# Patient Record
Sex: Female | Born: 1950
Health system: Southern US, Community
[De-identification: ages and names within clinical notes are randomized; demographics above are authoritative.]

## PROBLEM LIST (undated history)

## (undated) DIAGNOSIS — Z8614 Personal history of Methicillin resistant Staphylococcus aureus infection: Secondary | ICD-10-CM

## (undated) DIAGNOSIS — E785 Hyperlipidemia, unspecified: Secondary | ICD-10-CM

## (undated) DIAGNOSIS — E559 Vitamin D deficiency, unspecified: Secondary | ICD-10-CM

## (undated) DIAGNOSIS — E119 Type 2 diabetes mellitus without complications: Secondary | ICD-10-CM

## (undated) DIAGNOSIS — L03039 Cellulitis of unspecified toe: Secondary | ICD-10-CM

## (undated) HISTORY — PX: BLADDER REPAIR: SHX76

## (undated) HISTORY — PX: ABDOMINAL HYSTERECTOMY: SHX81

---

## 2008-11-11 ENCOUNTER — Ambulatory Visit: Payer: Self-pay | Admitting: Obstetrics and Gynecology

## 2010-04-14 ENCOUNTER — Ambulatory Visit: Payer: Self-pay | Admitting: Internal Medicine

## 2011-06-29 ENCOUNTER — Ambulatory Visit: Payer: Self-pay | Admitting: Internal Medicine

## 2011-08-14 ENCOUNTER — Ambulatory Visit: Payer: Self-pay | Admitting: Gastroenterology

## 2012-08-16 ENCOUNTER — Ambulatory Visit: Payer: Self-pay | Admitting: Internal Medicine

## 2013-08-22 ENCOUNTER — Ambulatory Visit: Payer: Self-pay | Admitting: Internal Medicine

## 2014-08-28 ENCOUNTER — Ambulatory Visit: Payer: Self-pay | Admitting: Internal Medicine

## 2015-08-20 ENCOUNTER — Other Ambulatory Visit: Payer: Self-pay | Admitting: Internal Medicine

## 2015-08-20 DIAGNOSIS — Z1231 Encounter for screening mammogram for malignant neoplasm of breast: Secondary | ICD-10-CM

## 2015-08-30 ENCOUNTER — Ambulatory Visit
Admission: RE | Admit: 2015-08-30 | Discharge: 2015-08-30 | Disposition: A | Discharge status: Home / Self Care | Payer: Managed Care, Other (non HMO) | Source: Ambulatory Visit | Attending: Internal Medicine | Admitting: Internal Medicine

## 2015-08-30 DIAGNOSIS — Z1231 Encounter for screening mammogram for malignant neoplasm of breast: Secondary | ICD-10-CM | POA: Insufficient documentation

## 2016-10-19 ENCOUNTER — Other Ambulatory Visit: Payer: Self-pay | Admitting: Internal Medicine

## 2016-10-19 DIAGNOSIS — Z1231 Encounter for screening mammogram for malignant neoplasm of breast: Secondary | ICD-10-CM

## 2016-11-24 ENCOUNTER — Ambulatory Visit
Admission: RE | Admit: 2016-11-24 | Discharge: 2016-11-24 | Disposition: A | Discharge status: Home / Self Care | Payer: Managed Care, Other (non HMO) | Source: Ambulatory Visit | Attending: Internal Medicine | Admitting: Internal Medicine

## 2016-11-24 DIAGNOSIS — Z1231 Encounter for screening mammogram for malignant neoplasm of breast: Secondary | ICD-10-CM | POA: Insufficient documentation

## 2017-01-18 ENCOUNTER — Encounter: Payer: Self-pay | Admitting: *Deleted

## 2017-01-19 ENCOUNTER — Ambulatory Visit: Payer: Medicare HMO | Admitting: Anesthesiology

## 2017-01-19 ENCOUNTER — Ambulatory Visit
Admission: RE | Admit: 2017-01-19 | Discharge: 2017-01-19 | Disposition: A | Discharge status: Home / Self Care | Payer: Medicare HMO | Source: Ambulatory Visit | Attending: Unknown Physician Specialty | Admitting: Unknown Physician Specialty

## 2017-01-19 ENCOUNTER — Encounter
Admission: RE | Disposition: A | Discharge status: Home / Self Care | Payer: Self-pay | Source: Ambulatory Visit | Attending: Unknown Physician Specialty

## 2017-01-19 DIAGNOSIS — E119 Type 2 diabetes mellitus without complications: Secondary | ICD-10-CM | POA: Insufficient documentation

## 2017-01-19 DIAGNOSIS — Z79899 Other long term (current) drug therapy: Secondary | ICD-10-CM | POA: Diagnosis not present

## 2017-01-19 DIAGNOSIS — K640 First degree hemorrhoids: Secondary | ICD-10-CM | POA: Diagnosis not present

## 2017-01-19 DIAGNOSIS — Z86010 Personal history of colonic polyps: Secondary | ICD-10-CM | POA: Insufficient documentation

## 2017-01-19 DIAGNOSIS — Z7984 Long term (current) use of oral hypoglycemic drugs: Secondary | ICD-10-CM | POA: Insufficient documentation

## 2017-01-19 DIAGNOSIS — E559 Vitamin D deficiency, unspecified: Secondary | ICD-10-CM | POA: Insufficient documentation

## 2017-01-19 DIAGNOSIS — K635 Polyp of colon: Secondary | ICD-10-CM | POA: Diagnosis not present

## 2017-01-19 DIAGNOSIS — D124 Benign neoplasm of descending colon: Secondary | ICD-10-CM | POA: Diagnosis not present

## 2017-01-19 DIAGNOSIS — Z1211 Encounter for screening for malignant neoplasm of colon: Secondary | ICD-10-CM | POA: Insufficient documentation

## 2017-01-19 DIAGNOSIS — Z800 Family history of malignant neoplasm of digestive organs: Secondary | ICD-10-CM | POA: Insufficient documentation

## 2017-01-19 DIAGNOSIS — E785 Hyperlipidemia, unspecified: Secondary | ICD-10-CM | POA: Diagnosis not present

## 2017-01-19 DIAGNOSIS — D123 Benign neoplasm of transverse colon: Secondary | ICD-10-CM | POA: Insufficient documentation

## 2017-01-19 HISTORY — DX: Vitamin D deficiency, unspecified: E55.9

## 2017-01-19 HISTORY — PX: COLONOSCOPY WITH PROPOFOL: SHX5780

## 2017-01-19 HISTORY — DX: Cellulitis of unspecified toe: L03.039

## 2017-01-19 HISTORY — DX: Type 2 diabetes mellitus without complications: E11.9

## 2017-01-19 HISTORY — DX: Hyperlipidemia, unspecified: E78.5

## 2017-01-19 LAB — GLUCOSE, CAPILLARY: GLUCOSE-CAPILLARY: 224 mg/dL — AB (ref 65–99)

## 2017-01-19 SURGERY — COLONOSCOPY WITH PROPOFOL
Anesthesia: General

## 2017-01-19 MED ORDER — PHENYLEPHRINE HCL 10 MG/ML IJ SOLN
INTRAMUSCULAR | Status: AC
  Filled 2017-01-19: qty 1

## 2017-01-19 MED ORDER — PROPOFOL 10 MG/ML IV BOLUS
INTRAVENOUS | Status: DC | PRN
  Administered 2017-01-19: 30 mg via INTRAVENOUS

## 2017-01-19 MED ORDER — SODIUM CHLORIDE 0.9 % IV SOLN: INTRAVENOUS | Status: DC

## 2017-01-19 MED ORDER — EPHEDRINE 5 MG/ML INJ
INTRAVENOUS | Status: AC
  Filled 2017-01-19: qty 10

## 2017-01-19 MED ORDER — FENTANYL CITRATE (PF) 100 MCG/2ML IJ SOLN
INTRAMUSCULAR | Status: AC
  Filled 2017-01-19: qty 2

## 2017-01-19 MED ORDER — EPHEDRINE SULFATE 50 MG/ML IJ SOLN
INTRAMUSCULAR | Status: DC | PRN
  Administered 2017-01-19: 10 mg via INTRAVENOUS

## 2017-01-19 MED ORDER — PROPOFOL 500 MG/50ML IV EMUL
INTRAVENOUS | Status: AC
  Filled 2017-01-19: qty 50

## 2017-01-19 MED ORDER — SODIUM CHLORIDE 0.9 % IV SOLN
INTRAVENOUS | Status: DC
  Administered 2017-01-19: 1000 mL via INTRAVENOUS

## 2017-01-19 MED ORDER — MIDAZOLAM HCL 2 MG/2ML IJ SOLN
INTRAMUSCULAR | Status: DC | PRN
  Administered 2017-01-19: 2 mg via INTRAVENOUS

## 2017-01-19 MED ORDER — LIDOCAINE HCL 2 % EX GEL
CUTANEOUS | Status: AC
  Filled 2017-01-19: qty 5

## 2017-01-19 MED ORDER — FENTANYL CITRATE (PF) 100 MCG/2ML IJ SOLN
INTRAMUSCULAR | Status: DC | PRN
  Administered 2017-01-19: 100 ug via INTRAVENOUS

## 2017-01-19 MED ORDER — MIDAZOLAM HCL 2 MG/2ML IJ SOLN
INTRAMUSCULAR | Status: AC
  Filled 2017-01-19: qty 2

## 2017-01-19 MED ORDER — PROPOFOL 500 MG/50ML IV EMUL
INTRAVENOUS | Status: DC | PRN
  Administered 2017-01-19: 120 ug/kg/min via INTRAVENOUS

## 2017-01-19 MED ORDER — LIDOCAINE HCL (PF) 2 % IJ SOLN
INTRAMUSCULAR | Status: AC
  Filled 2017-01-19: qty 2

## 2017-01-19 NOTE — Transfer of Care (Signed)
Immediate Anesthesia Transfer of Care Note  Patient: Holly Luna  Procedure(s) Performed: Procedure(s): COLONOSCOPY WITH PROPOFOL (N/A)  Patient Location: PACU  Anesthesia Type:General  Level of Consciousness: awake  Airway & Oxygen Therapy: Patient Spontanous Breathing and Patient connected to nasal cannula oxygen  Post-op Assessment: Report given to RN and Post -op Vital signs reviewed and stable  Post vital signs: Reviewed  Last Vitals:  Vitals:   01/19/17 0650  BP: 122/63  Pulse: 68  Resp: 16  Temp: 36.8 C    Last Pain:  Vitals:   01/19/17 0650  TempSrc: Tympanic         Complications: No apparent anesthesia complications

## 2017-01-19 NOTE — Anesthesia Postprocedure Evaluation (Signed)
Anesthesia Post Note  Patient: Holly Luna  Procedure(s) Performed: Procedure(s) (LRB): COLONOSCOPY WITH PROPOFOL (N/A)  Patient location during evaluation: PACU Anesthesia Type: General Level of consciousness: awake Pain management: pain level controlled Vital Signs Assessment: post-procedure vital signs reviewed and stable Respiratory status: spontaneous breathing Cardiovascular status: stable Anesthetic complications: no     Last Vitals:  Vitals:   01/19/17 0815 01/19/17 0825  BP: (!) 103/55 108/66  Pulse: 79 75  Resp: 19 18  Temp: 36.2 C     Last Pain:  Vitals:   01/19/17 0815  TempSrc: Tympanic                 VAN STAVEREN,Shakara Tweedy

## 2017-01-19 NOTE — H&P (Signed)
   Primary Care Physician:  Rusty Aus, MD Primary Gastroenterologist:  Dr. Vira Agar  Pre-Procedure History & Physical: HPI:  Holly Luna is a 66 y.o. female is here for an colonoscopy.   Past Medical History:  Diagnosis Date  . Diabetes mellitus without complication ([place])   . Hyperlipidemia   . Onychia, toe   . Vitamin D deficiency     Past Surgical History:  Procedure Laterality Date  . ABDOMINAL HYSTERECTOMY    . BLADDER REPAIR      Prior to Admission medications   Medication Sig Start Date End Date Taking? Authorizing Provider  Cholecalciferol 10000 units TABS Take 1 tablet by mouth daily.   Yes Historical Provider, MD  cyanocobalamin 1000 MCG tablet Take 1,000 mcg by mouth daily.   Yes Historical Provider, MD  estradiol (ESTRACE) 1 MG tablet Take 1 mg by mouth daily.   Yes Historical Provider, MD  glimepiride (AMARYL) 4 MG tablet Take 4 mg by mouth daily with breakfast.   Yes Historical Provider, MD  Liraglutide (VICTOZA [place]) Inject 1.2 mg into the skin daily.   Yes Historical Provider, MD  lisinopril (PRINIVIL,ZESTRIL) 5 MG tablet Take 5 mg by mouth daily.   Yes Historical Provider, MD  metFORMIN (GLUCOPHAGE) 500 MG tablet Take 1,000 mg by mouth 2 (two) times daily with a meal.   Yes Historical Provider, MD  simvastatin (ZOCOR) 20 MG tablet Take 20 mg by mouth daily.    Historical Provider, MD    Allergies as of 12/28/2016  . (Not on File)    No family history on file.  Social History   Social History  . Marital status: Married    Spouse name: N/A  . Number of children: N/A  . Years of education: N/A   Occupational History  . Not on file.   Social History Main Topics  . Smoking status: Not on file  . Smokeless tobacco: Not on file  . Alcohol use Not on file  . Drug use: Unknown  . Sexual activity: Not on file   Other Topics Concern  . Not on file   Social History Narrative  . No narrative on file    Review of Systems: See HPI, otherwise  negative ROS  Physical Exam: BP 122/63   Pulse 68   Temp 98.3 F (36.8 C) (Tympanic)   Resp 16   Ht 5\' 3"  (1.6 m)   Wt 70.3 kg (155 lb)   SpO2 97%   BMI 27.46 kg/m  General:   Alert,  pleasant and cooperative in NAD Head:  Normocephalic and atraumatic. Neck:  Supple; no masses or thyromegaly. Lungs:  Clear throughout to auscultation.    Heart:  Regular rate and rhythm. Abdomen:  Soft, nontender and nondistended. Normal bowel sounds, without guarding, and without rebound.   Neurologic:  Alert and  oriented x4;  grossly normal neurologically.  Impression/Plan: Holly Luna is here for an colonoscopy to be performed for [facility] colon polyps and family history colon cancer in father  Risks, benefits, limitations, and alternatives regarding  colonoscopy have been reviewed with the patient.  Questions have been answered.  All parties agreeable.   Gaylyn Cheers, MD  01/19/2017, 7:30 AM

## 2017-01-19 NOTE — Anesthesia Post-op Follow-up Note (Signed)
Anesthesia QCDR form completed.        

## 2017-01-19 NOTE — Op Note (Signed)
[facility] Gastroenterology Patient Name: Holly Luna Procedure Date: 01/19/2017 7:20 AM MRN: [id number] Account #: [id number] Date of Birth: [birth year] Admit Type: Outpatient Age: 66 Room: [facility] ENDO ROOM 1 Gender: Female Note Status: Finalized Procedure:            Colonoscopy Indications:          Screening for colorectal malignant neoplasm Providers:            Manya Silvas, MD Referring MD:         Rusty Aus, MD (Referring MD) Medicines:            Propofol per Anesthesia Complications:        No immediate complications. Procedure:            Pre-Anesthesia Assessment:                       - After reviewing the risks and benefits, the patient                        was deemed in satisfactory condition to undergo the                        procedure.                       After obtaining informed consent, the colonoscope was                        passed under direct vision. Throughout the procedure,                        the patient's blood pressure, pulse, and oxygen                        saturations were monitored continuously. The                        Colonoscope was introduced through the anus and                        advanced to the the cecum, identified by appendiceal                        orifice and ileocecal valve. The colonoscopy was                        performed without difficulty. The patient tolerated the                        procedure well. The quality of the bowel preparation                        was good. Findings:      Six sessile polyps were found in the recto-sigmoid colon, sigmoid colon,       descending colon, splenic flexure, transverse colon and hepatic flexure.       The polyps were diminutive in size. These polyps were removed with a       jumbo cold forceps. Resection and retrieval were complete. These polyps       were removed with a jumbo cold forceps. Resection and retrieval  were       complete.  Internal hemorrhoids were found during endoscopy. The hemorrhoids were       small and Grade I (internal hemorrhoids that do not prolapse).      The exam was otherwise without abnormality. Impression:           - Six diminutive polyps at the recto-sigmoid colon, in                        the sigmoid colon, in the descending colon, at the                        splenic flexure, in the transverse colon and at the                        hepatic flexure, removed with a jumbo cold forceps.                        Resected and retrieved.                       - Internal hemorrhoids.                       - The examination was otherwise normal. Recommendation:       - Repeat colonoscopy in 3 years for surveillance. Manya Silvas, MD 01/19/2017 8:15:56 AM This report has been signed electronically. Number of Addenda: 0 Note Initiated On: 01/19/2017 7:20 AM Scope Withdrawal Time: 0 hours 25 minutes 37 seconds  Total Procedure Duration: 0 hours 32 minutes 38 seconds       [facility]

## 2017-01-19 NOTE — Anesthesia Preprocedure Evaluation (Signed)
Anesthesia Evaluation  Patient identified by MRN, date of birth, ID band Patient awake    Reviewed: Allergy & Precautions, NPO status , Patient's Chart, lab work & pertinent test results  Airway Mallampati: II       Dental  (+) Teeth Intact   Pulmonary neg pulmonary ROS,    breath sounds clear to auscultation       Cardiovascular Exercise Tolerance: Good  Rhythm:Regular     Neuro/Psych negative neurological ROS  negative psych ROS   GI/Hepatic negative GI ROS, Neg liver ROS,   Endo/Other  negative endocrine ROSdiabetes, Type 2, Oral Hypoglycemic Agents  Renal/GU negative Renal ROS     Musculoskeletal   Abdominal Normal abdominal exam  (+)   Peds  Hematology negative hematology ROS (+)   Anesthesia Other Findings   Reproductive/Obstetrics                             Anesthesia Physical Anesthesia Plan  ASA: II  Anesthesia Plan: General   Post-op Pain Management:    Induction: Intravenous  Airway Management Planned: Natural Airway and Nasal Cannula  Additional Equipment:   Intra-op Plan:   Post-operative Plan:   Informed Consent: I have reviewed the patients History and Physical, chart, labs and discussed the procedure including the risks, benefits and alternatives for the proposed anesthesia with the patient or authorized representative who has indicated his/her understanding and acceptance.     Plan Discussed with: CRNA  Anesthesia Plan Comments:         Anesthesia Quick Evaluation

## 2017-01-22 ENCOUNTER — Encounter: Payer: Self-pay | Admitting: Unknown Physician Specialty

## 2017-01-22 LAB — SURGICAL PATHOLOGY

## 2017-12-31 ENCOUNTER — Other Ambulatory Visit: Payer: Self-pay | Admitting: Internal Medicine

## 2017-12-31 DIAGNOSIS — Z1231 Encounter for screening mammogram for malignant neoplasm of breast: Secondary | ICD-10-CM

## 2018-01-28 ENCOUNTER — Ambulatory Visit
Admission: RE | Admit: 2018-01-28 | Discharge: 2018-01-28 | Disposition: A | Discharge status: Home / Self Care | Payer: Medicare HMO | Source: Ambulatory Visit | Attending: Internal Medicine | Admitting: Internal Medicine

## 2018-01-28 DIAGNOSIS — Z1231 Encounter for screening mammogram for malignant neoplasm of breast: Secondary | ICD-10-CM

## 2019-02-24 ENCOUNTER — Other Ambulatory Visit: Payer: Self-pay | Admitting: Internal Medicine

## 2019-02-24 DIAGNOSIS — Z1231 Encounter for screening mammogram for malignant neoplasm of breast: Secondary | ICD-10-CM

## 2020-03-08 ENCOUNTER — Ambulatory Visit
Admission: RE | Admit: 2020-03-08 | Discharge: 2020-03-08 | Disposition: A | Discharge status: Home / Self Care | Payer: Medicare HMO | Source: Ambulatory Visit | Attending: Internal Medicine | Admitting: Internal Medicine

## 2020-03-08 DIAGNOSIS — Z1231 Encounter for screening mammogram for malignant neoplasm of breast: Secondary | ICD-10-CM | POA: Diagnosis not present

## 2020-03-10 ENCOUNTER — Other Ambulatory Visit: Payer: Self-pay | Admitting: Internal Medicine

## 2020-03-10 DIAGNOSIS — R928 Other abnormal and inconclusive findings on diagnostic imaging of breast: Secondary | ICD-10-CM

## 2020-03-10 DIAGNOSIS — N6320 Unspecified lump in the left breast, unspecified quadrant: Secondary | ICD-10-CM

## 2020-03-19 ENCOUNTER — Ambulatory Visit
Admission: RE | Admit: 2020-03-19 | Discharge: 2020-03-19 | Disposition: A | Discharge status: Home / Self Care | Payer: Medicare HMO | Source: Ambulatory Visit | Attending: Internal Medicine | Admitting: Internal Medicine

## 2020-03-19 DIAGNOSIS — R928 Other abnormal and inconclusive findings on diagnostic imaging of breast: Secondary | ICD-10-CM | POA: Insufficient documentation

## 2020-03-19 DIAGNOSIS — N6320 Unspecified lump in the left breast, unspecified quadrant: Secondary | ICD-10-CM | POA: Diagnosis present

## 2020-03-24 ENCOUNTER — Other Ambulatory Visit: Payer: Self-pay | Admitting: Internal Medicine

## 2020-03-24 DIAGNOSIS — N6320 Unspecified lump in the left breast, unspecified quadrant: Secondary | ICD-10-CM

## 2020-11-29 ENCOUNTER — Ambulatory Visit
Admission: RE | Admit: 2020-11-29 | Discharge: 2020-11-29 | Disposition: A | Discharge status: Home / Self Care | Payer: Medicare HMO | Source: Ambulatory Visit | Attending: Internal Medicine | Admitting: Internal Medicine

## 2020-11-29 ENCOUNTER — Other Ambulatory Visit: Payer: Self-pay

## 2020-11-29 DIAGNOSIS — N6002 Solitary cyst of left breast: Secondary | ICD-10-CM | POA: Diagnosis not present

## 2020-11-29 DIAGNOSIS — N6320 Unspecified lump in the left breast, unspecified quadrant: Secondary | ICD-10-CM | POA: Diagnosis present

## 2021-01-31 DIAGNOSIS — M79675 Pain in left toe(s): Secondary | ICD-10-CM | POA: Diagnosis not present

## 2021-01-31 DIAGNOSIS — M79674 Pain in right toe(s): Secondary | ICD-10-CM | POA: Diagnosis not present

## 2021-01-31 DIAGNOSIS — B351 Tinea unguium: Secondary | ICD-10-CM | POA: Diagnosis not present

## 2021-01-31 DIAGNOSIS — L601 Onycholysis: Secondary | ICD-10-CM | POA: Diagnosis not present

## 2021-01-31 DIAGNOSIS — E119 Type 2 diabetes mellitus without complications: Secondary | ICD-10-CM | POA: Diagnosis not present

## 2021-02-03 DIAGNOSIS — I152 Hypertension secondary to endocrine disorders: Secondary | ICD-10-CM | POA: Diagnosis not present

## 2021-02-03 DIAGNOSIS — E1165 Type 2 diabetes mellitus with hyperglycemia: Secondary | ICD-10-CM | POA: Diagnosis not present

## 2021-02-03 DIAGNOSIS — E1159 Type 2 diabetes mellitus with other circulatory complications: Secondary | ICD-10-CM | POA: Diagnosis not present

## 2021-02-03 DIAGNOSIS — E785 Hyperlipidemia, unspecified: Secondary | ICD-10-CM | POA: Diagnosis not present

## 2021-02-03 DIAGNOSIS — Z794 Long term (current) use of insulin: Secondary | ICD-10-CM | POA: Diagnosis not present

## 2021-02-03 DIAGNOSIS — E1169 Type 2 diabetes mellitus with other specified complication: Secondary | ICD-10-CM | POA: Diagnosis not present

## 2021-05-23 DIAGNOSIS — Z794 Long term (current) use of insulin: Secondary | ICD-10-CM | POA: Diagnosis not present

## 2021-05-23 DIAGNOSIS — I152 Hypertension secondary to endocrine disorders: Secondary | ICD-10-CM | POA: Diagnosis not present

## 2021-05-23 DIAGNOSIS — E785 Hyperlipidemia, unspecified: Secondary | ICD-10-CM | POA: Diagnosis not present

## 2021-05-23 DIAGNOSIS — E1165 Type 2 diabetes mellitus with hyperglycemia: Secondary | ICD-10-CM | POA: Diagnosis not present

## 2021-05-23 DIAGNOSIS — E1169 Type 2 diabetes mellitus with other specified complication: Secondary | ICD-10-CM | POA: Diagnosis not present

## 2021-05-23 DIAGNOSIS — E1159 Type 2 diabetes mellitus with other circulatory complications: Secondary | ICD-10-CM | POA: Diagnosis not present

## 2021-06-06 DIAGNOSIS — E1165 Type 2 diabetes mellitus with hyperglycemia: Secondary | ICD-10-CM | POA: Diagnosis not present

## 2021-06-13 DIAGNOSIS — Z794 Long term (current) use of insulin: Secondary | ICD-10-CM | POA: Diagnosis not present

## 2021-06-13 DIAGNOSIS — Z0000 Encounter for general adult medical examination without abnormal findings: Secondary | ICD-10-CM | POA: Diagnosis not present

## 2021-06-13 DIAGNOSIS — E1165 Type 2 diabetes mellitus with hyperglycemia: Secondary | ICD-10-CM | POA: Diagnosis not present

## 2021-06-13 DIAGNOSIS — D500 Iron deficiency anemia secondary to blood loss (chronic): Secondary | ICD-10-CM | POA: Diagnosis not present

## 2021-06-13 DIAGNOSIS — E782 Mixed hyperlipidemia: Secondary | ICD-10-CM | POA: Diagnosis not present

## 2021-06-13 DIAGNOSIS — D369 Benign neoplasm, unspecified site: Secondary | ICD-10-CM | POA: Diagnosis not present

## 2021-06-13 DIAGNOSIS — E538 Deficiency of other specified B group vitamins: Secondary | ICD-10-CM | POA: Diagnosis not present

## 2021-07-11 DIAGNOSIS — E538 Deficiency of other specified B group vitamins: Secondary | ICD-10-CM | POA: Diagnosis not present

## 2021-07-11 DIAGNOSIS — D500 Iron deficiency anemia secondary to blood loss (chronic): Secondary | ICD-10-CM | POA: Diagnosis not present

## 2021-08-15 DIAGNOSIS — D509 Iron deficiency anemia, unspecified: Secondary | ICD-10-CM | POA: Diagnosis not present

## 2021-08-24 DIAGNOSIS — D509 Iron deficiency anemia, unspecified: Secondary | ICD-10-CM | POA: Diagnosis not present

## 2021-10-07 ENCOUNTER — Encounter: Payer: Self-pay | Admitting: *Deleted

## 2021-10-10 ENCOUNTER — Ambulatory Visit
Admission: RE | Admit: 2021-10-10 | Discharge: 2021-10-10 | Disposition: A | Discharge status: Home / Self Care | Payer: HMO | Attending: Gastroenterology | Admitting: Gastroenterology

## 2021-10-10 ENCOUNTER — Ambulatory Visit: Payer: HMO | Admitting: Certified Registered Nurse Anesthetist

## 2021-10-10 ENCOUNTER — Encounter: Payer: Self-pay | Admitting: *Deleted

## 2021-10-10 ENCOUNTER — Encounter
Admission: RE | Disposition: A | Discharge status: Home / Self Care | Payer: Self-pay | Source: Home / Self Care | Attending: Gastroenterology

## 2021-10-10 DIAGNOSIS — K2950 Unspecified chronic gastritis without bleeding: Secondary | ICD-10-CM | POA: Diagnosis not present

## 2021-10-10 DIAGNOSIS — D122 Benign neoplasm of ascending colon: Secondary | ICD-10-CM | POA: Insufficient documentation

## 2021-10-10 DIAGNOSIS — Q402 Other specified congenital malformations of stomach: Secondary | ICD-10-CM | POA: Diagnosis not present

## 2021-10-10 DIAGNOSIS — K3189 Other diseases of stomach and duodenum: Secondary | ICD-10-CM | POA: Diagnosis not present

## 2021-10-10 DIAGNOSIS — Z7985 Long-term (current) use of injectable non-insulin antidiabetic drugs: Secondary | ICD-10-CM | POA: Insufficient documentation

## 2021-10-10 DIAGNOSIS — Z794 Long term (current) use of insulin: Secondary | ICD-10-CM | POA: Diagnosis not present

## 2021-10-10 DIAGNOSIS — Z7984 Long term (current) use of oral hypoglycemic drugs: Secondary | ICD-10-CM | POA: Diagnosis not present

## 2021-10-10 DIAGNOSIS — K222 Esophageal obstruction: Secondary | ICD-10-CM | POA: Insufficient documentation

## 2021-10-10 DIAGNOSIS — Z79899 Other long term (current) drug therapy: Secondary | ICD-10-CM | POA: Diagnosis not present

## 2021-10-10 DIAGNOSIS — D509 Iron deficiency anemia, unspecified: Secondary | ICD-10-CM | POA: Diagnosis not present

## 2021-10-10 DIAGNOSIS — Z79890 Hormone replacement therapy (postmenopausal): Secondary | ICD-10-CM | POA: Insufficient documentation

## 2021-10-10 DIAGNOSIS — K269 Duodenal ulcer, unspecified as acute or chronic, without hemorrhage or perforation: Secondary | ICD-10-CM | POA: Insufficient documentation

## 2021-10-10 DIAGNOSIS — K635 Polyp of colon: Secondary | ICD-10-CM | POA: Diagnosis not present

## 2021-10-10 DIAGNOSIS — K2980 Duodenitis without bleeding: Secondary | ICD-10-CM | POA: Diagnosis not present

## 2021-10-10 HISTORY — PX: COLONOSCOPY: SHX5424

## 2021-10-10 HISTORY — PX: ESOPHAGOGASTRODUODENOSCOPY: SHX5428

## 2021-10-10 HISTORY — DX: Personal history of Methicillin resistant Staphylococcus aureus infection: Z86.14

## 2021-10-10 LAB — GLUCOSE, CAPILLARY: Glucose-Capillary: 114 mg/dL — ABNORMAL HIGH (ref 70–99)

## 2021-10-10 SURGERY — COLONOSCOPY
Anesthesia: General

## 2021-10-10 MED ORDER — PROPOFOL 500 MG/50ML IV EMUL
INTRAVENOUS | Status: AC
  Filled 2021-10-10: qty 50

## 2021-10-10 MED ORDER — GLYCOPYRROLATE 0.2 MG/ML IJ SOLN
INTRAMUSCULAR | Status: DC | PRN
  Administered 2021-10-10: .2 mg via INTRAVENOUS

## 2021-10-10 MED ORDER — PROPOFOL 500 MG/50ML IV EMUL
INTRAVENOUS | Status: DC | PRN
  Administered 2021-10-10: 150 ug/kg/min via INTRAVENOUS

## 2021-10-10 MED ORDER — LIDOCAINE HCL (PF) 2 % IJ SOLN
INTRAMUSCULAR | Status: AC
  Filled 2021-10-10: qty 5

## 2021-10-10 MED ORDER — LIDOCAINE HCL (CARDIAC) PF 100 MG/5ML IV SOSY
PREFILLED_SYRINGE | INTRAVENOUS | Status: DC | PRN
  Administered 2021-10-10: 50 mg via INTRAVENOUS

## 2021-10-10 MED ORDER — SODIUM CHLORIDE 0.9 % IV SOLN
INTRAVENOUS | Status: DC
  Administered 2021-10-10: 20 mL/h via INTRAVENOUS

## 2021-10-10 MED ORDER — PROPOFOL 10 MG/ML IV BOLUS
INTRAVENOUS | Status: DC | PRN
  Administered 2021-10-10: 60 mg via INTRAVENOUS

## 2021-10-10 NOTE — Op Note (Signed)
[facility] Gastroenterology Patient Name: Holly Luna Procedure Date: 10/10/2021 8:29 AM MRN: [id number] Account #: [id number] Date of Birth: 1951-01-21 Admit Type: Outpatient Age: 70 Room: [facility] ENDO ROOM 2 Gender: Female Note Status: Finalized Instrument Name: Colonscope [id number] Procedure:             Colonoscopy Indications:           Iron deficiency anemia Providers:             Annamaria Helling DO, DO Medicines:             Monitored Anesthesia Care Complications:         No immediate complications. Estimated blood loss:                         Minimal. Procedure:             Pre-Anesthesia Assessment:                        - Prior to the procedure, a History and Physical was                         performed, and patient medications and allergies were                         reviewed. The patient is competent. The risks and                         benefits of the procedure and the sedation options and                         risks were discussed with the patient. All questions                         were answered and informed consent was obtained.                         Patient identification and proposed procedure were                         verified by the physician, the nurse, the anesthetist                         and the technician in the endoscopy suite. Mental                         Status Examination: alert and oriented. Airway                         Examination: normal oropharyngeal airway and neck                         mobility. Respiratory Examination: clear to                         auscultation. CV Examination: RRR, no murmurs, no S3                         or S4. Prophylactic Antibiotics: The patient does not  require prophylactic antibiotics. Prior                         Anticoagulants: The patient has taken no previous                         anticoagulant or antiplatelet agents. ASA Grade                          Assessment: II - A patient with mild systemic disease.                         After reviewing the risks and benefits, the patient                         was deemed in satisfactory condition to undergo the                         procedure. The anesthesia plan was to use monitored                         anesthesia care (MAC). Immediately prior to                         administration of medications, the patient was                         re-assessed for adequacy to receive sedatives. The                         heart rate, respiratory rate, oxygen saturations,                         blood pressure, adequacy of pulmonary ventilation, and                         response to care were monitored throughout the                         procedure. The physical status of the patient was                         re-assessed after the procedure.                        After obtaining informed consent, the colonoscope was                         passed under direct vision. Throughout the procedure,                         the patient's blood pressure, pulse, and oxygen                         saturations were monitored continuously. The                         Colonoscope was introduced through the anus and  advanced to the the cecum, identified by appendiceal                         orifice and ileocecal valve. The colonoscopy was                         performed without difficulty. The patient tolerated                         the procedure well. The quality of the bowel                         preparation was evaluated using the BBPS [facility] Bowel                         Preparation Scale) with scores of: Right Colon = 3                         (entire mucosa seen well with no residual staining,                         small fragments of stool or opaque liquid), Transverse                         Colon = 3 (entire mucosa seen well with no residual                          staining, small fragments of stool or opaque liquid)                         and Left Colon = 2 (minor amount of residual staining,                         small fragments of stool and/or opaque liquid, but                         mucosa seen well). The total BBPS score equals 8. The                         quality of the bowel preparation was excellent. Findings:      The perianal and digital rectal examinations were normal. Pertinent       negatives include normal sphincter tone.      A 2 to 3 mm polyp was found in the ascending colon. The polyp was       sessile. The polyp was removed with a cold biopsy forceps. Resection and       retrieval were complete. Estimated blood loss was minimal.      The exam was otherwise without abnormality on direct and retroflexion       views. Impression:            - One 2 to 3 mm polyp in the ascending colon, removed                         with a cold biopsy forceps. Resected and retrieved.                        -  The examination was otherwise normal on direct and                         retroflexion views. Recommendation:        - Discharge patient to home (ambulatory).                        - Resume previous diet.                        - Continue present medications.                        - Await pathology results.                        - Repeat colonoscopy for surveillance based on                         pathology results.                        - Return to referring physician as previously                         scheduled. Procedure Code(s):     --- Professional ---                        [phone], Colonoscopy, flexible; with biopsy, single or                         multiple Diagnosis Code(s):     --- Professional ---                        K63.5, Polyp of colon                        D50.9, Iron deficiency anemia, unspecified CPT copyright 2019 American Medical Association. All rights reserved. The codes documented in this  report are preliminary and upon coder review may  be revised to meet current compliance requirements. Attending Participation:      I personally performed the entire procedure. Volney American, DO Annamaria Helling DO, DO 10/10/2021 9:58:26 AM This report has been signed electronically. Number of Addenda: 0 Note Initiated On: 10/10/2021 8:29 AM Scope Withdrawal Time: 0 hours 18 minutes 47 seconds  Total Procedure Duration: 0 hours 37 minutes 39 seconds  Estimated Blood Loss:  Estimated blood loss was minimal.      [facility]

## 2021-10-10 NOTE — Interval H&P Note (Signed)
History and Physical Interval Note: Preprocedure H&P from 10/10/21  was reviewed and there was no interval change after seeing and examining the patient.  Written consent was obtained from the patient after discussion of risks, benefits, and alternatives. Patient has consented to proceed with Esophagogastroduodenoscopy and Colonoscopy with possible intervention   10/10/2021 8:48 AM  Holly Luna  has presented today for surgery, with the diagnosis of IDA.  The various methods of treatment have been discussed with the patient and family. After consideration of risks, benefits and other options for treatment, the patient has consented to  Procedure(s): COLONOSCOPY (N/A) ESOPHAGOGASTRODUODENOSCOPY (EGD) (N/A) as a surgical intervention.  The patient's history has been reviewed, patient examined, no change in status, stable for surgery.  I have reviewed the patient's chart and labs.  Questions were answered to the patient's satisfaction.     Holly Luna

## 2021-10-10 NOTE — Transfer of Care (Signed)
Immediate Anesthesia Transfer of Care Note  Patient: Holly Luna  Procedure(s) Performed: COLONOSCOPY ESOPHAGOGASTRODUODENOSCOPY (EGD)  Patient Location: Endoscopy Unit  Anesthesia Type:General  Level of Consciousness: drowsy  Airway & Oxygen Therapy: Patient Spontanous Breathing, oral airway in place  Post-op Assessment: Report given to RN and Post -op Vital signs reviewed and stable  Post vital signs: Reviewed and stable  Last Vitals:  Vitals Value Taken Time  BP 126/64 10/10/21 0945  Temp    Pulse 77 10/10/21 0945  Resp 23 10/10/21 0945  SpO2 97 % 10/10/21 0945  Vitals shown include unvalidated device data.  Last Pain:  Vitals:   10/10/21 0729  TempSrc: Temporal  PainSc: 0-No pain         Complications: No notable events documented.

## 2021-10-10 NOTE — Op Note (Signed)
[facility] Gastroenterology Patient Name: Holly Luna Procedure Date: 10/10/2021 8:30 AM MRN: [id number] Account #: [id number] Date of Birth: Nov 07, 1951 Admit Type: Outpatient Age: 70 Room: [facility] ENDO ROOM 2 Gender: Female Note Status: Finalized Instrument Name: Upper Endoscope [id number] Procedure:             Upper GI endoscopy Indications:           Iron deficiency anemia Providers:             Annamaria Helling DO, DO Medicines:             Monitored Anesthesia Care Complications:         No immediate complications. Estimated blood loss:                         Minimal. Procedure:             Pre-Anesthesia Assessment:                        - Prior to the procedure, a History and Physical was                         performed, and patient medications and allergies were                         reviewed. The patient is competent. The risks and                         benefits of the procedure and the sedation options and                         risks were discussed with the patient. All questions                         were answered and informed consent was obtained.                         Patient identification and proposed procedure were                         verified by the physician, the nurse, the anesthetist                         and the technician in the endoscopy suite. Mental                         Status Examination: alert and oriented. Airway                         Examination: normal oropharyngeal airway and neck                         mobility. Respiratory Examination: clear to                         auscultation. CV Examination: RRR, no murmurs, no S3                         or S4. Prophylactic Antibiotics: The patient does  not                         require prophylactic antibiotics. Prior                         Anticoagulants: The patient has taken no previous                         anticoagulant or antiplatelet agents. ASA Grade                          Assessment: II - A patient with mild systemic disease.                         After reviewing the risks and benefits, the patient                         was deemed in satisfactory condition to undergo the                         procedure. The anesthesia plan was to use monitored                         anesthesia care (MAC). Immediately prior to                         administration of medications, the patient was                         re-assessed for adequacy to receive sedatives. The                         heart rate, respiratory rate, oxygen saturations,                         blood pressure, adequacy of pulmonary ventilation, and                         response to care were monitored throughout the                         procedure. The physical status of the patient was                         re-assessed after the procedure.                        After obtaining informed consent, the endoscope was                         passed under direct vision. Throughout the procedure,                         the patient's blood pressure, pulse, and oxygen                         saturations were monitored continuously. The Endoscope  was introduced through the mouth, and advanced to the                         second part of duodenum. The upper GI endoscopy was                         accomplished without difficulty. The patient tolerated                         the procedure well. Findings:      A few localized erosions without bleeding were found in the duodenal       bulb. Biopsies for histology were taken with a cold forceps for       evaluation of celiac disease. Estimated blood loss was minimal.      The entire examined stomach was normal. Biopsies were taken with a cold       forceps for Helicobacter pylori testing. Estimated blood loss was       minimal.      The Z-line was regular and was found 35 cm from the incisors.       Esophagogastric landmarks were identified: the gastroesophageal junction       was found at 35 cm from the incisors.      A widely patent Schatzki ring was found at the gastroesophageal       junction. The scope was withdrawn. Dilation was performed with a Maloney       dilator with no resistance at 73 Fr. The dilation site was examined       following endoscope reinsertion and showed mild mucosal disruption. Ring       disrupted on relook endoscopy Estimated blood loss was minimal.      Normal mucosa was found in the entire esophagus. Impression:            - Duodenal erosions without bleeding. Biopsied.                        - Normal stomach. Biopsied.                        - Z-line regular, 35 cm from the incisors.                        - Esophagogastric landmarks identified.                        - Widely patent Schatzki ring. Dilated. Ring disrupted                         on relook endoscopy                        - Normal mucosa was found in the entire esophagus. Recommendation:        - Discharge patient to home.                        - Soft diet today.                        - Continue present medications.                        -  No ibuprofen, naproxen, or other non-steroidal                         anti-inflammatory drugs for 3 days.                        - Continue proton pump inhibitor. Procedure Code(s):     --- Professional ---                        [phone], Esophagogastroduodenoscopy, flexible,                         transoral; with biopsy, single or multiple                        [id number], Dilation of esophagus, by unguided sound or                         bougie, single or multiple passes Diagnosis Code(s):     --- Professional ---                        K26.9, Duodenal ulcer, unspecified as acute or                         chronic, without hemorrhage or perforation                        K22.2, Esophageal obstruction                        D50.9, Iron deficiency  anemia, unspecified CPT copyright 2019 American Medical Association. All rights reserved. The codes documented in this report are preliminary and upon coder review may  be revised to meet current compliance requirements. Attending Participation:      I personally performed the entire procedure. Volney American, DO Annamaria Helling DO, DO 10/10/2021 9:52:06 AM This report has been signed electronically. Number of Addenda: 0 Note Initiated On: 10/10/2021 8:30 AM Estimated Blood Loss:  Estimated blood loss was minimal.      [facility]

## 2021-10-10 NOTE — Anesthesia Postprocedure Evaluation (Signed)
Anesthesia Post Note  Patient: Holly Luna  Procedure(s) Performed: COLONOSCOPY ESOPHAGOGASTRODUODENOSCOPY (EGD)  Patient location during evaluation: Endoscopy Anesthesia Type: General Level of consciousness: awake and alert and oriented Pain management: pain level controlled Vital Signs Assessment: post-procedure vital signs reviewed and stable Respiratory status: spontaneous breathing, nonlabored ventilation and respiratory function stable Cardiovascular status: blood pressure returned to baseline and stable Postop Assessment: no signs of nausea or vomiting Anesthetic complications: no   No notable events documented.   Last Vitals:  Vitals:   10/10/21 0940 10/10/21 0950  BP: 126/64 111/61  Pulse: 74 72  Resp: 20 20  Temp: (!) 35.9 C   SpO2: 96% 97%    Last Pain:  Vitals:   10/10/21 0940  TempSrc: Temporal  PainSc:                  Zamere Pasternak

## 2021-10-10 NOTE — H&P (View-Only) (Signed)
[facility] Pre-Procedure H&P   Patient ID: Holly Luna is a 70 y.o. female.  Gastroenterology Provider: Annamaria Helling, DO  Referring Provider: Laurine Blazer, PA PCP: Rusty Aus, MD  Date: 10/10/2021  HPI Ms. Holly Luna is a 70 y.o. female who presents today for Esophagogastroduodenoscopy and Colonoscopy for iron deficiency anemia.  Taking aleve several times a week. On ppi. Taking iron at home but held for procedure. S/p hysterectomy Ferritin 9; sat 31% iron 125, tibc 400 hgb 12.7 mcv 86.  2018 colonoscopy with 4 tubular adenomas all removed with forceps  Patient denies nausea, vomiting, coffee ground emesis, hematemesis, abdominal pain, diarrhea, constipation, melena, hematochezia, fever, chills, dysphagia, odynophagia, heartburn/reflux.   Past Medical History:  Diagnosis Date   Diabetes mellitus without complication ([place])    History of MRSA infection    Hyperlipidemia    Onychia, toe    Vitamin D deficiency     Past Surgical History:  Procedure Laterality Date   ABDOMINAL HYSTERECTOMY     BLADDER REPAIR     COLONOSCOPY WITH PROPOFOL N/A 01/19/2017   Procedure: COLONOSCOPY WITH PROPOFOL;  Surgeon: Manya Silvas, MD;  Location: [facility];  Service: Endoscopy;  Laterality: N/A;    Family History Father- CRC- 90s No h/o GI disease or malignancy  Review of Systems  Constitutional:  Negative for activity change, appetite change, fatigue, fever and unexpected weight change.  HENT:  Negative for trouble swallowing and voice change.   Respiratory:  Negative for shortness of breath and wheezing.   Cardiovascular:  Negative for chest pain and palpitations.  Gastrointestinal:  Negative for abdominal distention, abdominal pain, anal bleeding, blood in stool, constipation, diarrhea, nausea, rectal pain and vomiting.  Musculoskeletal:  Negative for arthralgias and myalgias.  Skin:  Negative for color change and pallor.  Neurological:   Negative for dizziness, syncope and weakness.  Psychiatric/Behavioral:  Negative for confusion.   All other systems reviewed and are negative.   Medications No current facility-administered medications on file prior to encounter.   Current Outpatient Medications on File Prior to Encounter  Medication Sig Dispense Refill   Cholecalciferol 10000 units TABS Take 1 tablet by mouth daily.     cyanocobalamin 1000 MCG tablet Take 1,000 mcg by mouth daily.     estradiol (ESTRACE) 1 MG tablet Take 1 mg by mouth daily.     glimepiride (AMARYL) 4 MG tablet Take 4 mg by mouth daily with breakfast.     Liraglutide (VICTOZA [place]) Inject 1.2 mg into the skin daily.     lisinopril (PRINIVIL,ZESTRIL) 5 MG tablet Take 5 mg by mouth daily.     metFORMIN (GLUCOPHAGE) 500 MG tablet Take 1,000 mg by mouth 2 (two) times daily with a meal.     simvastatin (ZOCOR) 20 MG tablet Take 20 mg by mouth daily.      Pertinent medications related to GI and procedure were reviewed by me with the patient prior to the procedure   Current Facility-Administered Medications:    0.9 %  sodium chloride infusion, , Intravenous, Continuous, Annamaria Helling, DO, Last Rate: 20 mL/hr at 10/10/21 7106, Continued from Pre-op at 10/10/21 0808  sodium chloride 20 mL/hr at 10/10/21 2694       No Known Allergies Allergies were reviewed by me prior to the procedure  Objective    Vitals:   10/10/21 0729  BP: (!) 142/69  Pulse: 67  Resp: 20  Temp: (!) 96.6 F (35.9 C)  TempSrc: Temporal  SpO2: 99%  Weight: 72.6 kg  Height: 5\' 3"  (1.6 m)     Physical Exam Vitals reviewed.  Constitutional:      General: She is not in acute distress.    Appearance: Normal appearance. She is not ill-appearing, toxic-appearing or diaphoretic.  HENT:     Head: Normocephalic and atraumatic.     Nose: Nose normal.     Mouth/Throat:     Mouth: Mucous membranes are moist.     Pharynx: Oropharynx is clear.  Eyes:     General: No  scleral icterus.    Extraocular Movements: Extraocular movements intact.  Cardiovascular:     Rate and Rhythm: Normal rate and regular rhythm.     Heart sounds: Normal heart sounds. No murmur heard.   No friction rub. No gallop.  Pulmonary:     Effort: Pulmonary effort is normal. No respiratory distress.     Breath sounds: Normal breath sounds. No wheezing, rhonchi or rales.  Abdominal:     General: Bowel sounds are normal. There is no distension.     Palpations: Abdomen is soft.     Tenderness: There is no abdominal tenderness. There is no guarding or rebound.  Musculoskeletal:     Cervical back: Neck supple.     Right lower leg: No edema.     Left lower leg: No edema.  Skin:    General: Skin is warm and dry.     Coloration: Skin is not jaundiced or pale.  Neurological:     General: No focal deficit present.     Mental Status: She is alert and oriented to person, place, and time. Mental status is at baseline.  Psychiatric:        Mood and Affect: Mood normal.        Behavior: Behavior normal.        Thought Content: Thought content normal.        Judgment: Judgment normal.     Assessment:  Ms. Holly Luna is a 70 y.o. female  who presents today for Esophagogastroduodenoscopy and Colonoscopy for iron deficiency anemia.  Plan:  Esophagogastroduodenoscopy and Colonoscopy with possible intervention today  Esophagogastroduodenoscopy and colonoscopy with possible biopsy, control of bleeding, polypectomy, and interventions as necessary has been discussed with the patient/patient representative. Informed consent was obtained from the patient/patient representative after explaining the indication, nature, and risks of the procedure including but not limited to death, bleeding, perforation, missed neoplasm/lesions, cardiorespiratory compromise, and reaction to medications. Opportunity for questions was given and appropriate answers were provided. Patient/patient representative has  verbalized understanding is amenable to undergoing the procedure.   Annamaria Helling, DO  [facility]  Portions of the record may have been created with voice recognition software. Occasional wrong-word or 'sound-a-like' substitutions may have occurred due to the inherent limitations of voice recognition software.  Read the chart carefully and recognize, using context, where substitutions may have occurred.

## 2021-10-10 NOTE — Anesthesia Preprocedure Evaluation (Signed)
Anesthesia Evaluation  Patient identified by MRN, date of birth, ID band Patient awake    Reviewed: Allergy & Precautions, NPO status , Patient's Chart, lab work & pertinent test results  History of Anesthesia Complications Negative for: history of anesthetic complications  Airway Mallampati: II  TM Distance: >3 FB Neck ROM: Full    Dental no notable dental hx.    Pulmonary neg pulmonary ROS, neg sleep apnea, neg COPD,    breath sounds clear to auscultation- rhonchi (-) wheezing      Cardiovascular Exercise Tolerance: Good (-) hypertension(-) CAD, (-) Past MI, (-) Cardiac Stents and (-) CABG  Rhythm:Regular Rate:Normal - Systolic murmurs and - Diastolic murmurs    Neuro/Psych neg Seizures negative neurological ROS  negative psych ROS   GI/Hepatic negative GI ROS, Neg liver ROS,   Endo/Other  diabetes, Insulin Dependent  Renal/GU negative Renal ROS     Musculoskeletal negative musculoskeletal ROS (+)   Abdominal (+) - obese,   Peds  Hematology negative hematology ROS (+)   Anesthesia Other Findings Past Medical History: No date: Diabetes mellitus without complication ([facility]) No date: History of MRSA infection No date: Hyperlipidemia No date: Onychia, toe No date: Vitamin D deficiency   Reproductive/Obstetrics                             Anesthesia Physical Anesthesia Plan  ASA: 2  Anesthesia Plan: General   Post-op Pain Management:    Induction: Intravenous  PONV Risk Score and Plan: 2 and Propofol infusion  Airway Management Planned: Natural Airway  Additional Equipment:   Intra-op Plan:   Post-operative Plan:   Informed Consent: I have reviewed the patients History and Physical, chart, labs and discussed the procedure including the risks, benefits and alternatives for the proposed anesthesia with the patient or authorized representative who has indicated his/her  understanding and acceptance.     Dental advisory given  Plan Discussed with: CRNA and Anesthesiologist  Anesthesia Plan Comments:         Anesthesia Quick Evaluation

## 2021-10-10 NOTE — Consult Note (Signed)
[facility] Pre-Procedure H&P   Patient ID: Holly Luna is a 70 y.o. female.  Gastroenterology Provider: Annamaria Helling, DO  Referring Provider: Laurine Blazer, PA PCP: Rusty Aus, MD  Date: 10/10/2021  HPI Ms. Holly Luna is a 70 y.o. female who presents today for Esophagogastroduodenoscopy and Colonoscopy for iron deficiency anemia.  Taking aleve several times a week. On ppi. Taking iron at home but held for procedure. S/p hysterectomy Ferritin 9; sat 31% iron 125, tibc 400 hgb 12.7 mcv 86.  2018 colonoscopy with 4 tubular adenomas all removed with forceps  Patient denies nausea, vomiting, coffee ground emesis, hematemesis, abdominal pain, diarrhea, constipation, melena, hematochezia, fever, chills, dysphagia, odynophagia, heartburn/reflux.   Past Medical History:  Diagnosis Date   Diabetes mellitus without complication ([place])    History of MRSA infection    Hyperlipidemia    Onychia, toe    Vitamin D deficiency     Past Surgical History:  Procedure Laterality Date   ABDOMINAL HYSTERECTOMY     BLADDER REPAIR     COLONOSCOPY WITH PROPOFOL N/A 01/19/2017   Procedure: COLONOSCOPY WITH PROPOFOL;  Surgeon: Manya Silvas, MD;  Location: [place];  Service: Endoscopy;  Laterality: N/A;    Family History Father- CRC- 90s No h/o GI disease or malignancy  Review of Systems  Constitutional:  Negative for activity change, appetite change, fatigue, fever and unexpected weight change.  HENT:  Negative for trouble swallowing and voice change.   Respiratory:  Negative for shortness of breath and wheezing.   Cardiovascular:  Negative for chest pain and palpitations.  Gastrointestinal:  Negative for abdominal distention, abdominal pain, anal bleeding, blood in stool, constipation, diarrhea, nausea, rectal pain and vomiting.  Musculoskeletal:  Negative for arthralgias and myalgias.  Skin:  Negative for color change and pallor.  Neurological:   Negative for dizziness, syncope and weakness.  Psychiatric/Behavioral:  Negative for confusion.   All other systems reviewed and are negative.   Medications No current facility-administered medications on file prior to encounter.   Current Outpatient Medications on File Prior to Encounter  Medication Sig Dispense Refill   Cholecalciferol 10000 units TABS Take 1 tablet by mouth daily.     cyanocobalamin 1000 MCG tablet Take 1,000 mcg by mouth daily.     estradiol (ESTRACE) 1 MG tablet Take 1 mg by mouth daily.     glimepiride (AMARYL) 4 MG tablet Take 4 mg by mouth daily with breakfast.     Liraglutide (VICTOZA [place]) Inject 1.2 mg into the skin daily.     lisinopril (PRINIVIL,ZESTRIL) 5 MG tablet Take 5 mg by mouth daily.     metFORMIN (GLUCOPHAGE) 500 MG tablet Take 1,000 mg by mouth 2 (two) times daily with a meal.     simvastatin (ZOCOR) 20 MG tablet Take 20 mg by mouth daily.      Pertinent medications related to GI and procedure were reviewed by me with the patient prior to the procedure   Current Facility-Administered Medications:    0.9 %  sodium chloride infusion, , Intravenous, Continuous, Annamaria Helling, DO, Last Rate: 20 mL/hr at 10/10/21 8676, Continued from Pre-op at 10/10/21 0808  sodium chloride 20 mL/hr at 10/10/21 1950       No Known Allergies Allergies were reviewed by me prior to the procedure  Objective    Vitals:   10/10/21 0729  BP: (!) 142/69  Pulse: 67  Resp: 20  Temp: (!) 96.6 F (35.9 C)  TempSrc: Temporal  SpO2: 99%  Weight: 72.6 kg  Height: 5\' 3"  (1.6 m)     Physical Exam Vitals reviewed.  Constitutional:      General: She is not in acute distress.    Appearance: Normal appearance. She is not ill-appearing, toxic-appearing or diaphoretic.  HENT:     Head: Normocephalic and atraumatic.     Nose: Nose normal.     Mouth/Throat:     Mouth: Mucous membranes are moist.     Pharynx: Oropharynx is clear.  Eyes:     General: No  scleral icterus.    Extraocular Movements: Extraocular movements intact.  Cardiovascular:     Rate and Rhythm: Normal rate and regular rhythm.     Heart sounds: Normal heart sounds. No murmur heard.   No friction rub. No gallop.  Pulmonary:     Effort: Pulmonary effort is normal. No respiratory distress.     Breath sounds: Normal breath sounds. No wheezing, rhonchi or rales.  Abdominal:     General: Bowel sounds are normal. There is no distension.     Palpations: Abdomen is soft.     Tenderness: There is no abdominal tenderness. There is no guarding or rebound.  Musculoskeletal:     Cervical back: Neck supple.     Right lower leg: No edema.     Left lower leg: No edema.  Skin:    General: Skin is warm and dry.     Coloration: Skin is not jaundiced or pale.  Neurological:     General: No focal deficit present.     Mental Status: She is alert and oriented to person, place, and time. Mental status is at baseline.  Psychiatric:        Mood and Affect: Mood normal.        Behavior: Behavior normal.        Thought Content: Thought content normal.        Judgment: Judgment normal.     Assessment:  Ms. Holly Luna is a 70 y.o. female  who presents today for Esophagogastroduodenoscopy and Colonoscopy for iron deficiency anemia.  Plan:  Esophagogastroduodenoscopy and Colonoscopy with possible intervention today  Esophagogastroduodenoscopy and colonoscopy with possible biopsy, control of bleeding, polypectomy, and interventions as necessary has been discussed with the patient/patient representative. Informed consent was obtained from the patient/patient representative after explaining the indication, nature, and risks of the procedure including but not limited to death, bleeding, perforation, missed neoplasm/lesions, cardiorespiratory compromise, and reaction to medications. Opportunity for questions was given and appropriate answers were provided. Patient/patient representative has  verbalized understanding is amenable to undergoing the procedure.   Annamaria Helling, DO  [facility]  Portions of the record may have been created with voice recognition software. Occasional wrong-word or 'sound-a-like' substitutions may have occurred due to the inherent limitations of voice recognition software.  Read the chart carefully and recognize, using context, where substitutions may have occurred.

## 2021-10-11 ENCOUNTER — Encounter: Payer: Self-pay | Admitting: Gastroenterology

## 2021-10-11 LAB — SURGICAL PATHOLOGY

## 2021-10-17 DIAGNOSIS — E1165 Type 2 diabetes mellitus with hyperglycemia: Secondary | ICD-10-CM | POA: Diagnosis not present

## 2021-10-17 DIAGNOSIS — Z794 Long term (current) use of insulin: Secondary | ICD-10-CM | POA: Diagnosis not present

## 2021-12-05 DIAGNOSIS — E538 Deficiency of other specified B group vitamins: Secondary | ICD-10-CM | POA: Diagnosis not present

## 2021-12-05 DIAGNOSIS — E1165 Type 2 diabetes mellitus with hyperglycemia: Secondary | ICD-10-CM | POA: Diagnosis not present

## 2021-12-05 DIAGNOSIS — Z794 Long term (current) use of insulin: Secondary | ICD-10-CM | POA: Diagnosis not present

## 2021-12-13 DIAGNOSIS — Z794 Long term (current) use of insulin: Secondary | ICD-10-CM | POA: Diagnosis not present

## 2021-12-13 DIAGNOSIS — Z0000 Encounter for general adult medical examination without abnormal findings: Secondary | ICD-10-CM | POA: Diagnosis not present

## 2021-12-13 DIAGNOSIS — E1165 Type 2 diabetes mellitus with hyperglycemia: Secondary | ICD-10-CM | POA: Diagnosis not present

## 2021-12-13 DIAGNOSIS — E538 Deficiency of other specified B group vitamins: Secondary | ICD-10-CM | POA: Diagnosis not present

## 2022-03-30 DIAGNOSIS — L718 Other rosacea: Secondary | ICD-10-CM | POA: Diagnosis not present

## 2022-03-30 DIAGNOSIS — L821 Other seborrheic keratosis: Secondary | ICD-10-CM | POA: Diagnosis not present

## 2022-03-30 DIAGNOSIS — L298 Other pruritus: Secondary | ICD-10-CM | POA: Diagnosis not present

## 2022-04-10 DIAGNOSIS — E1165 Type 2 diabetes mellitus with hyperglycemia: Secondary | ICD-10-CM | POA: Diagnosis not present

## 2022-04-10 DIAGNOSIS — Z794 Long term (current) use of insulin: Secondary | ICD-10-CM | POA: Diagnosis not present

## 2022-04-17 DIAGNOSIS — E1165 Type 2 diabetes mellitus with hyperglycemia: Secondary | ICD-10-CM | POA: Diagnosis not present

## 2022-04-17 DIAGNOSIS — Z794 Long term (current) use of insulin: Secondary | ICD-10-CM | POA: Diagnosis not present

## 2022-05-18 ENCOUNTER — Other Ambulatory Visit: Payer: Self-pay | Admitting: Internal Medicine

## 2022-05-18 DIAGNOSIS — Z1231 Encounter for screening mammogram for malignant neoplasm of breast: Secondary | ICD-10-CM

## 2022-05-31 DIAGNOSIS — N3001 Acute cystitis with hematuria: Secondary | ICD-10-CM | POA: Diagnosis not present

## 2022-05-31 DIAGNOSIS — R300 Dysuria: Secondary | ICD-10-CM | POA: Diagnosis not present

## 2022-06-05 DIAGNOSIS — E1165 Type 2 diabetes mellitus with hyperglycemia: Secondary | ICD-10-CM | POA: Diagnosis not present

## 2022-06-05 DIAGNOSIS — E538 Deficiency of other specified B group vitamins: Secondary | ICD-10-CM | POA: Diagnosis not present

## 2022-06-05 DIAGNOSIS — Z794 Long term (current) use of insulin: Secondary | ICD-10-CM | POA: Diagnosis not present

## 2022-06-08 ENCOUNTER — Ambulatory Visit
Admission: RE | Admit: 2022-06-08 | Discharge: 2022-06-08 | Disposition: A | Discharge status: Home / Self Care | Payer: HMO | Source: Ambulatory Visit | Attending: Internal Medicine | Admitting: Internal Medicine

## 2022-06-08 DIAGNOSIS — Z1231 Encounter for screening mammogram for malignant neoplasm of breast: Secondary | ICD-10-CM

## 2022-06-14 DIAGNOSIS — Z0000 Encounter for general adult medical examination without abnormal findings: Secondary | ICD-10-CM | POA: Diagnosis not present

## 2022-06-14 DIAGNOSIS — E538 Deficiency of other specified B group vitamins: Secondary | ICD-10-CM | POA: Diagnosis not present

## 2022-06-14 DIAGNOSIS — Z794 Long term (current) use of insulin: Secondary | ICD-10-CM | POA: Diagnosis not present

## 2022-06-14 DIAGNOSIS — E1165 Type 2 diabetes mellitus with hyperglycemia: Secondary | ICD-10-CM | POA: Diagnosis not present

## 2022-07-31 DIAGNOSIS — E1165 Type 2 diabetes mellitus with hyperglycemia: Secondary | ICD-10-CM | POA: Diagnosis not present

## 2022-07-31 DIAGNOSIS — Z794 Long term (current) use of insulin: Secondary | ICD-10-CM | POA: Diagnosis not present

## 2022-08-07 DIAGNOSIS — E1159 Type 2 diabetes mellitus with other circulatory complications: Secondary | ICD-10-CM | POA: Diagnosis not present

## 2022-08-07 DIAGNOSIS — E785 Hyperlipidemia, unspecified: Secondary | ICD-10-CM | POA: Diagnosis not present

## 2022-08-07 DIAGNOSIS — E1169 Type 2 diabetes mellitus with other specified complication: Secondary | ICD-10-CM | POA: Diagnosis not present

## 2022-08-07 DIAGNOSIS — E1165 Type 2 diabetes mellitus with hyperglycemia: Secondary | ICD-10-CM | POA: Diagnosis not present

## 2022-08-07 DIAGNOSIS — Z794 Long term (current) use of insulin: Secondary | ICD-10-CM | POA: Diagnosis not present

## 2022-08-07 DIAGNOSIS — I152 Hypertension secondary to endocrine disorders: Secondary | ICD-10-CM | POA: Diagnosis not present

## 2022-08-11 DIAGNOSIS — E1165 Type 2 diabetes mellitus with hyperglycemia: Secondary | ICD-10-CM | POA: Diagnosis not present

## 2022-09-09 IMAGING — MG MM DIGITAL SCREENING BILAT W/ TOMO AND CAD
8 series · 8 of 24 positions shown · non-contrast
Comparison: Previous exam(s).

CLINICAL DATA: Screening.

EXAM:
DIGITAL SCREENING BILATERAL MAMMOGRAM WITH TOMOSYNTHESIS AND CAD
TECHNIQUE: Bilateral screening digital craniocaudal and mediolateral oblique
mammograms were obtained. Bilateral screening digital breast
tomosynthesis was performed. The images were evaluated with
computer-aided detection.

[R MLO synth-2D]
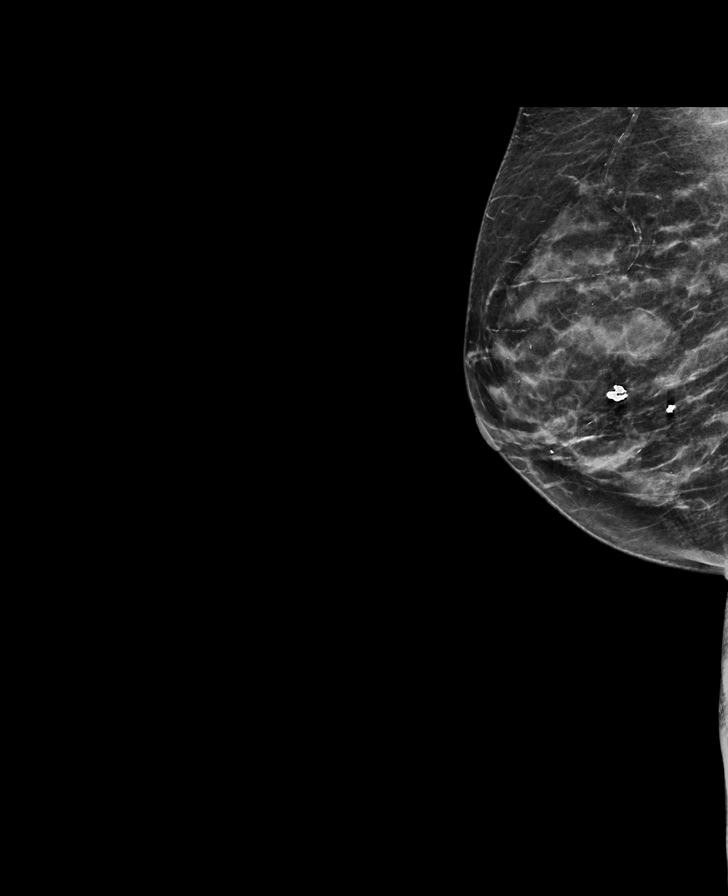

[L MLO synth-2D]
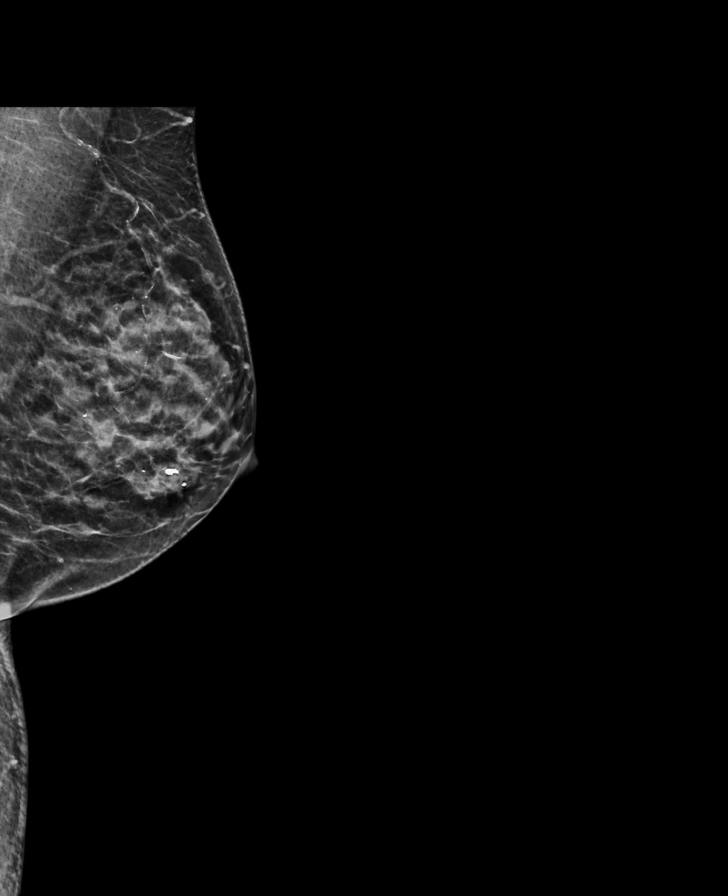

[L CC synth-2D]
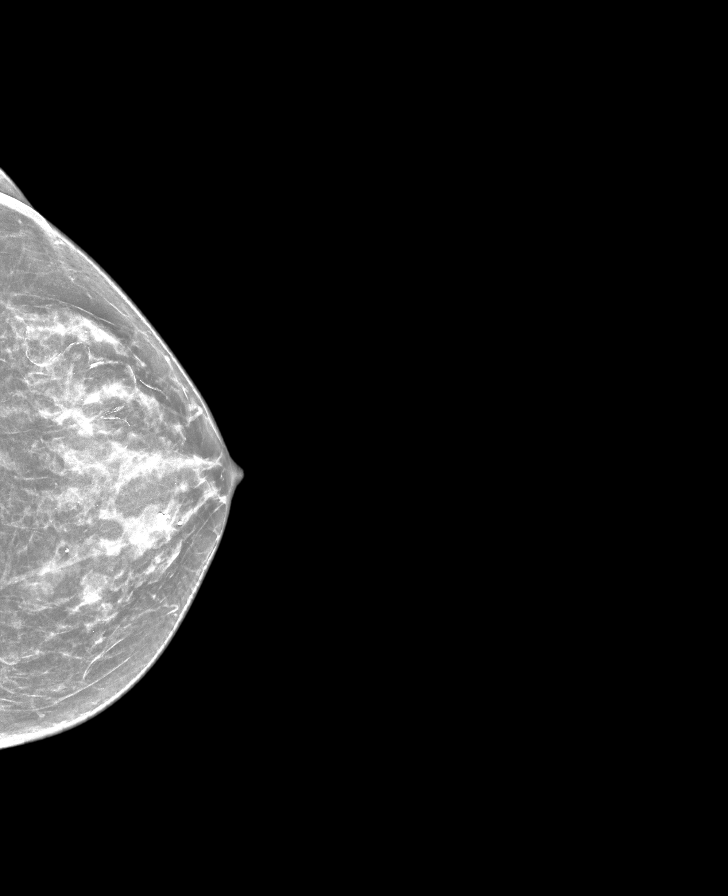

[R CC synth-2D]
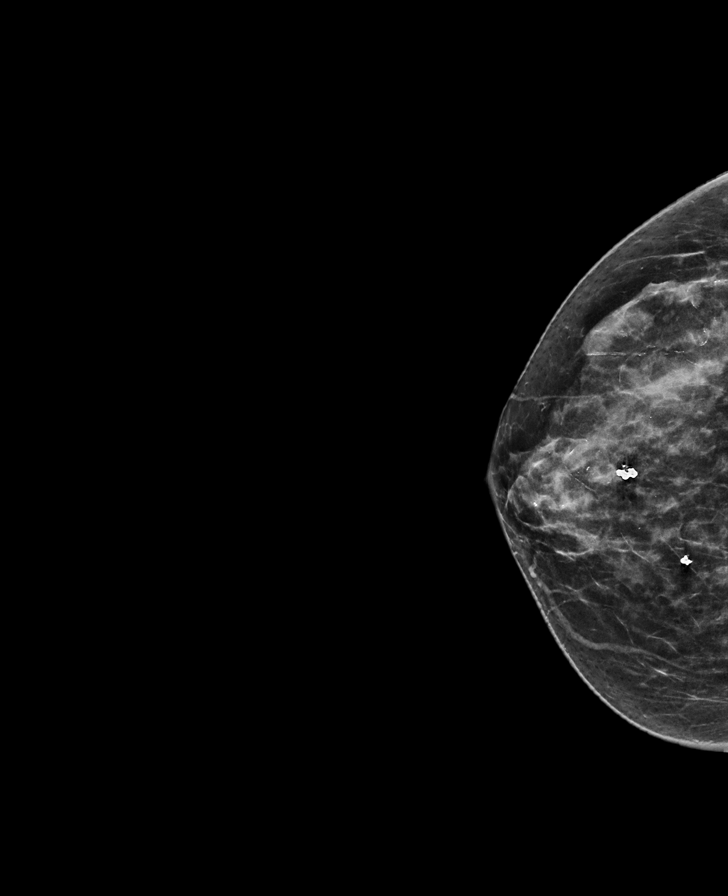

[R MLO tomo · tomo slice 31/61.0]
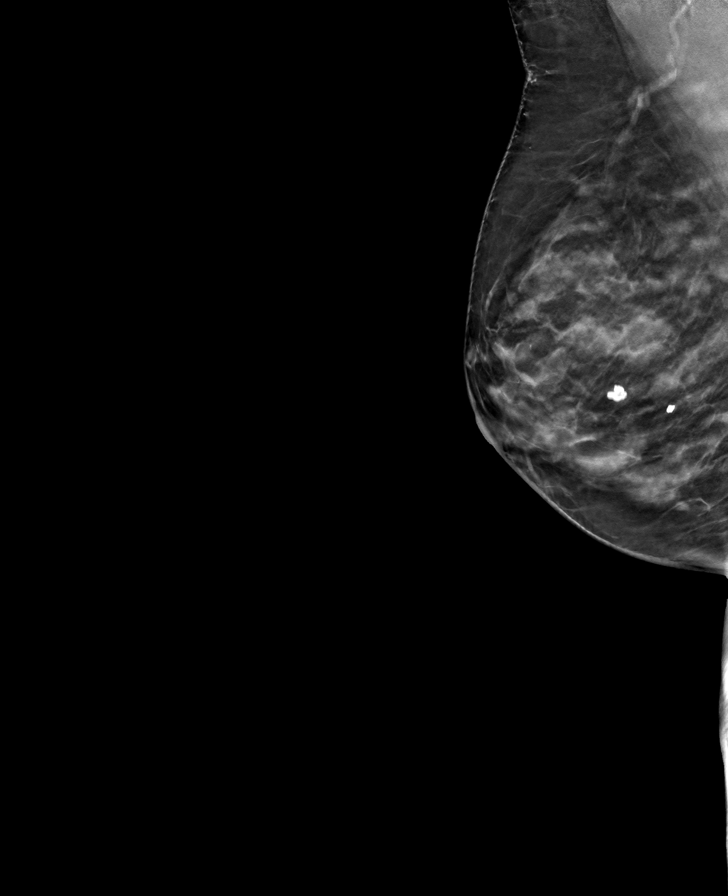

[L MLO tomo · tomo slice 27/53.0]
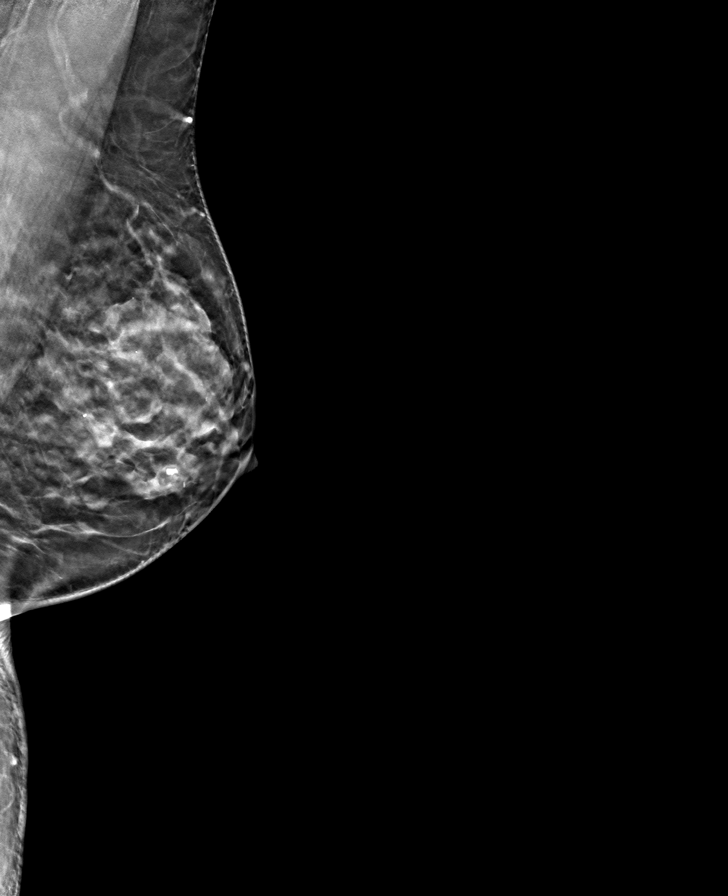

[R CC tomo · tomo slice 28/55.0]
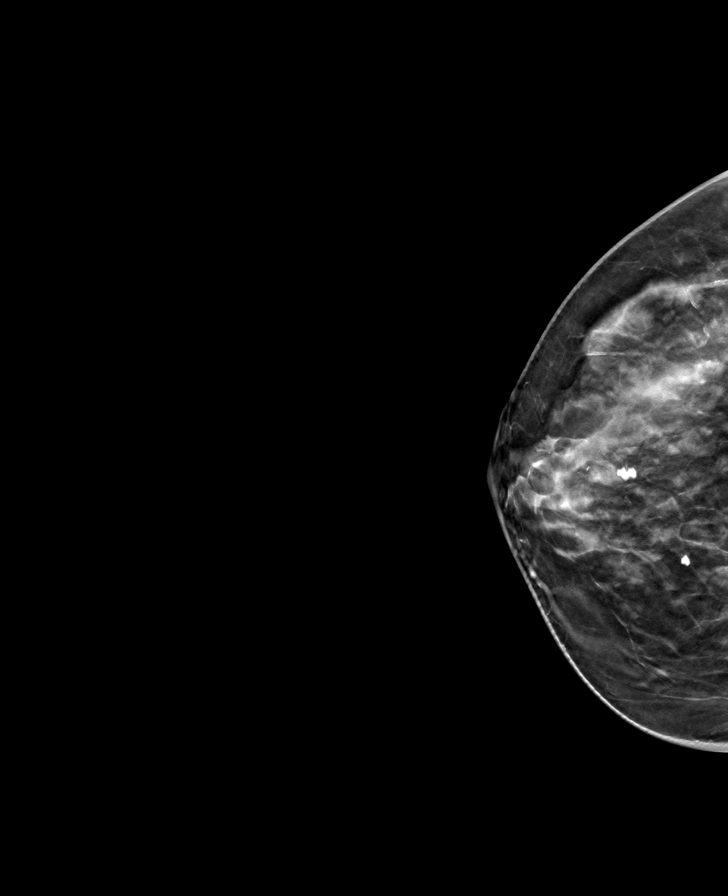

[L CC tomo · tomo slice 26/51.0]
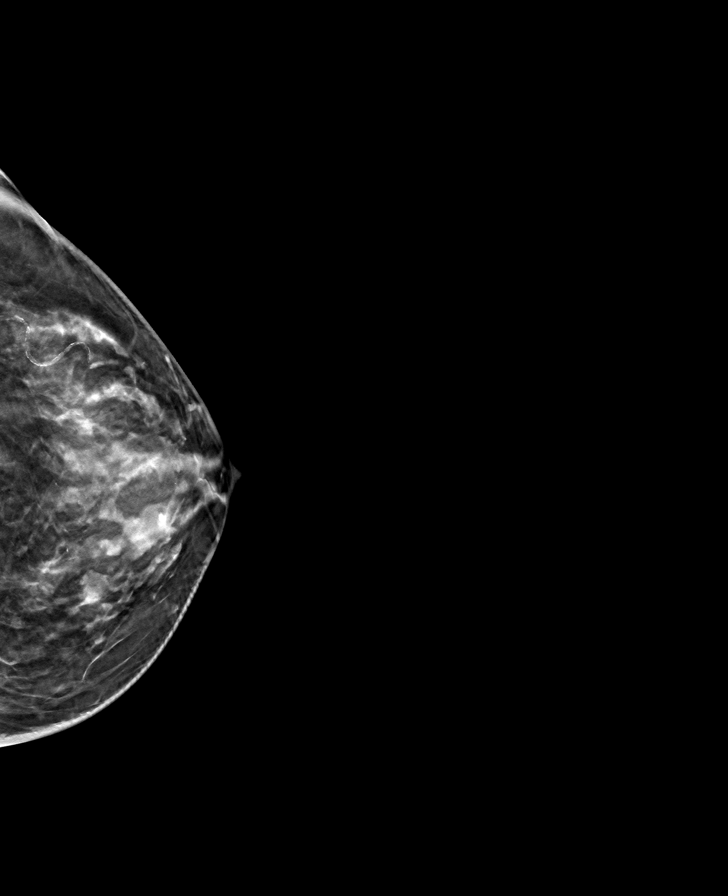

[8 of 24 positions shown; findings below may reference images not displayed]

ACR Breast Density Category c: The breast tissue is heterogeneously
dense, which may obscure small masses.
FINDINGS: There are no findings suspicious for malignancy.
IMPRESSION: No mammographic evidence of malignancy. A result letter of this
screening mammogram will be mailed directly to the patient.

RECOMMENDATION:
Screening mammogram in one year. (Code:Q3-W-BC3)

BI-RADS CATEGORY  1: Negative.

## 2022-09-11 DIAGNOSIS — E1165 Type 2 diabetes mellitus with hyperglycemia: Secondary | ICD-10-CM | POA: Diagnosis not present

## 2022-12-19 DIAGNOSIS — Z0000 Encounter for general adult medical examination without abnormal findings: Secondary | ICD-10-CM | POA: Diagnosis not present

## 2022-12-19 DIAGNOSIS — E1165 Type 2 diabetes mellitus with hyperglycemia: Secondary | ICD-10-CM | POA: Diagnosis not present

## 2022-12-19 DIAGNOSIS — E538 Deficiency of other specified B group vitamins: Secondary | ICD-10-CM | POA: Diagnosis not present

## 2022-12-19 DIAGNOSIS — Z794 Long term (current) use of insulin: Secondary | ICD-10-CM | POA: Diagnosis not present

## 2022-12-21 DIAGNOSIS — E1165 Type 2 diabetes mellitus with hyperglycemia: Secondary | ICD-10-CM | POA: Diagnosis not present

## 2022-12-21 DIAGNOSIS — I152 Hypertension secondary to endocrine disorders: Secondary | ICD-10-CM | POA: Diagnosis not present

## 2022-12-21 DIAGNOSIS — Z794 Long term (current) use of insulin: Secondary | ICD-10-CM | POA: Diagnosis not present

## 2022-12-21 DIAGNOSIS — E1159 Type 2 diabetes mellitus with other circulatory complications: Secondary | ICD-10-CM | POA: Diagnosis not present

## 2022-12-21 DIAGNOSIS — E1169 Type 2 diabetes mellitus with other specified complication: Secondary | ICD-10-CM | POA: Diagnosis not present

## 2022-12-21 DIAGNOSIS — E785 Hyperlipidemia, unspecified: Secondary | ICD-10-CM | POA: Diagnosis not present

## 2023-05-28 DIAGNOSIS — L298 Other pruritus: Secondary | ICD-10-CM | POA: Diagnosis not present

## 2023-05-28 DIAGNOSIS — L218 Other seborrheic dermatitis: Secondary | ICD-10-CM | POA: Diagnosis not present

## 2023-05-28 DIAGNOSIS — L821 Other seborrheic keratosis: Secondary | ICD-10-CM | POA: Diagnosis not present

## 2023-06-11 DIAGNOSIS — E1165 Type 2 diabetes mellitus with hyperglycemia: Secondary | ICD-10-CM | POA: Diagnosis not present

## 2023-06-11 DIAGNOSIS — E538 Deficiency of other specified B group vitamins: Secondary | ICD-10-CM | POA: Diagnosis not present

## 2023-06-18 DIAGNOSIS — I152 Hypertension secondary to endocrine disorders: Secondary | ICD-10-CM | POA: Diagnosis not present

## 2023-06-18 DIAGNOSIS — E538 Deficiency of other specified B group vitamins: Secondary | ICD-10-CM | POA: Diagnosis not present

## 2023-06-18 DIAGNOSIS — E1169 Type 2 diabetes mellitus with other specified complication: Secondary | ICD-10-CM | POA: Diagnosis not present

## 2023-06-18 DIAGNOSIS — Z794 Long term (current) use of insulin: Secondary | ICD-10-CM | POA: Diagnosis not present

## 2023-06-18 DIAGNOSIS — E10649 Type 1 diabetes mellitus with hypoglycemia without coma: Secondary | ICD-10-CM | POA: Diagnosis not present

## 2023-06-18 DIAGNOSIS — E785 Hyperlipidemia, unspecified: Secondary | ICD-10-CM | POA: Diagnosis not present

## 2023-06-18 DIAGNOSIS — E1165 Type 2 diabetes mellitus with hyperglycemia: Secondary | ICD-10-CM | POA: Diagnosis not present

## 2023-06-18 DIAGNOSIS — E1159 Type 2 diabetes mellitus with other circulatory complications: Secondary | ICD-10-CM | POA: Diagnosis not present

## 2023-11-01 DIAGNOSIS — H5203 Hypermetropia, bilateral: Secondary | ICD-10-CM | POA: Diagnosis not present

## 2023-11-01 DIAGNOSIS — H52223 Regular astigmatism, bilateral: Secondary | ICD-10-CM | POA: Diagnosis not present

## 2023-11-01 DIAGNOSIS — Z794 Long term (current) use of insulin: Secondary | ICD-10-CM | POA: Diagnosis not present

## 2023-11-01 DIAGNOSIS — H524 Presbyopia: Secondary | ICD-10-CM | POA: Diagnosis not present

## 2023-11-01 DIAGNOSIS — E119 Type 2 diabetes mellitus without complications: Secondary | ICD-10-CM | POA: Diagnosis not present

## 2024-01-04 ENCOUNTER — Other Ambulatory Visit: Payer: Self-pay

## 2024-01-04 DIAGNOSIS — Z794 Long term (current) use of insulin: Secondary | ICD-10-CM | POA: Diagnosis not present

## 2024-01-04 DIAGNOSIS — E10649 Type 1 diabetes mellitus with hypoglycemia without coma: Secondary | ICD-10-CM | POA: Diagnosis not present

## 2024-01-04 DIAGNOSIS — E785 Hyperlipidemia, unspecified: Secondary | ICD-10-CM | POA: Diagnosis not present

## 2024-01-04 DIAGNOSIS — I152 Hypertension secondary to endocrine disorders: Secondary | ICD-10-CM | POA: Diagnosis not present

## 2024-01-04 MED ORDER — NOVOLIN 70/30 FLEXPEN (70-30) 100 UNIT/ML ~~LOC~~ SUPN
PEN_INJECTOR | SUBCUTANEOUS | 8 refills | Status: AC
  Filled 2024-01-04: qty 30, 50d supply, fill #0
  Filled 2024-01-08: qty 30, 60d supply, fill #0
  Filled 2024-03-19: qty 30, 50d supply, fill #1

## 2024-01-07 ENCOUNTER — Other Ambulatory Visit: Payer: Self-pay

## 2024-01-08 ENCOUNTER — Other Ambulatory Visit: Payer: Self-pay

## 2024-01-08 ENCOUNTER — Other Ambulatory Visit (HOSPITAL_COMMUNITY): Payer: Self-pay

## 2024-01-08 MED ORDER — INSUPEN PEN NEEDLES 31G X 5 MM MISC
1.0000 | Freq: Two times a day (BID) | 8 refills | Status: AC
  Filled 2024-01-08: qty 100, 50d supply, fill #0

## 2024-02-08 DIAGNOSIS — J301 Allergic rhinitis due to pollen: Secondary | ICD-10-CM | POA: Diagnosis not present

## 2024-02-08 DIAGNOSIS — H6983 Other specified disorders of Eustachian tube, bilateral: Secondary | ICD-10-CM | POA: Diagnosis not present

## 2024-02-08 DIAGNOSIS — M26643 Arthritis of bilateral temporomandibular joint: Secondary | ICD-10-CM | POA: Diagnosis not present

## 2024-03-19 ENCOUNTER — Other Ambulatory Visit: Payer: Self-pay

## 2024-03-20 ENCOUNTER — Other Ambulatory Visit: Payer: Self-pay

## 2024-03-21 DIAGNOSIS — H6983 Other specified disorders of Eustachian tube, bilateral: Secondary | ICD-10-CM | POA: Diagnosis not present

## 2024-04-01 ENCOUNTER — Other Ambulatory Visit: Payer: Self-pay

## 2024-04-08 DIAGNOSIS — E1165 Type 2 diabetes mellitus with hyperglycemia: Secondary | ICD-10-CM | POA: Diagnosis not present

## 2024-04-08 DIAGNOSIS — E1169 Type 2 diabetes mellitus with other specified complication: Secondary | ICD-10-CM | POA: Diagnosis not present

## 2024-04-08 DIAGNOSIS — Z794 Long term (current) use of insulin: Secondary | ICD-10-CM | POA: Diagnosis not present

## 2024-04-08 DIAGNOSIS — I152 Hypertension secondary to endocrine disorders: Secondary | ICD-10-CM | POA: Diagnosis not present

## 2024-04-08 DIAGNOSIS — E1159 Type 2 diabetes mellitus with other circulatory complications: Secondary | ICD-10-CM | POA: Diagnosis not present

## 2024-04-08 DIAGNOSIS — E785 Hyperlipidemia, unspecified: Secondary | ICD-10-CM | POA: Diagnosis not present

## 2024-05-01 DIAGNOSIS — E1165 Type 2 diabetes mellitus with hyperglycemia: Secondary | ICD-10-CM | POA: Diagnosis not present

## 2024-05-05 DIAGNOSIS — Z0000 Encounter for general adult medical examination without abnormal findings: Secondary | ICD-10-CM | POA: Diagnosis not present

## 2024-05-05 DIAGNOSIS — D369 Benign neoplasm, unspecified site: Secondary | ICD-10-CM | POA: Diagnosis not present

## 2024-05-05 DIAGNOSIS — E782 Mixed hyperlipidemia: Secondary | ICD-10-CM | POA: Diagnosis not present

## 2024-05-05 DIAGNOSIS — E1169 Type 2 diabetes mellitus with other specified complication: Secondary | ICD-10-CM | POA: Diagnosis not present

## 2024-05-05 DIAGNOSIS — Z1231 Encounter for screening mammogram for malignant neoplasm of breast: Secondary | ICD-10-CM | POA: Diagnosis not present

## 2024-05-05 DIAGNOSIS — E538 Deficiency of other specified B group vitamins: Secondary | ICD-10-CM | POA: Diagnosis not present

## 2024-05-05 DIAGNOSIS — D500 Iron deficiency anemia secondary to blood loss (chronic): Secondary | ICD-10-CM | POA: Diagnosis not present

## 2024-05-07 DIAGNOSIS — E538 Deficiency of other specified B group vitamins: Secondary | ICD-10-CM | POA: Diagnosis not present

## 2024-07-01 DIAGNOSIS — L821 Other seborrheic keratosis: Secondary | ICD-10-CM | POA: Diagnosis not present

## 2024-07-01 DIAGNOSIS — L720 Epidermal cyst: Secondary | ICD-10-CM | POA: Diagnosis not present

## 2024-07-01 DIAGNOSIS — D492 Neoplasm of unspecified behavior of bone, soft tissue, and skin: Secondary | ICD-10-CM | POA: Diagnosis not present

## 2024-07-01 DIAGNOSIS — L570 Actinic keratosis: Secondary | ICD-10-CM | POA: Diagnosis not present

## 2024-07-10 DIAGNOSIS — E785 Hyperlipidemia, unspecified: Secondary | ICD-10-CM | POA: Diagnosis not present

## 2024-07-10 DIAGNOSIS — I152 Hypertension secondary to endocrine disorders: Secondary | ICD-10-CM | POA: Diagnosis not present

## 2024-07-10 DIAGNOSIS — E1165 Type 2 diabetes mellitus with hyperglycemia: Secondary | ICD-10-CM | POA: Diagnosis not present

## 2024-07-10 DIAGNOSIS — E1159 Type 2 diabetes mellitus with other circulatory complications: Secondary | ICD-10-CM | POA: Diagnosis not present

## 2024-07-10 DIAGNOSIS — E1169 Type 2 diabetes mellitus with other specified complication: Secondary | ICD-10-CM | POA: Diagnosis not present

## 2024-07-10 DIAGNOSIS — Z794 Long term (current) use of insulin: Secondary | ICD-10-CM | POA: Diagnosis not present

## 2024-08-04 DIAGNOSIS — D500 Iron deficiency anemia secondary to blood loss (chronic): Secondary | ICD-10-CM | POA: Diagnosis not present

## 2024-08-04 DIAGNOSIS — E538 Deficiency of other specified B group vitamins: Secondary | ICD-10-CM | POA: Diagnosis not present

## 2024-09-09 DIAGNOSIS — D500 Iron deficiency anemia secondary to blood loss (chronic): Secondary | ICD-10-CM | POA: Diagnosis not present

## 2024-11-07 DIAGNOSIS — D500 Iron deficiency anemia secondary to blood loss (chronic): Secondary | ICD-10-CM | POA: Diagnosis not present

## 2024-11-07 DIAGNOSIS — E1169 Type 2 diabetes mellitus with other specified complication: Secondary | ICD-10-CM | POA: Diagnosis not present

## 2024-11-07 DIAGNOSIS — E538 Deficiency of other specified B group vitamins: Secondary | ICD-10-CM | POA: Diagnosis not present

## 2024-11-07 DIAGNOSIS — E782 Mixed hyperlipidemia: Secondary | ICD-10-CM | POA: Diagnosis not present

## 2024-11-17 DIAGNOSIS — E1165 Type 2 diabetes mellitus with hyperglycemia: Secondary | ICD-10-CM | POA: Diagnosis not present

## 2024-11-17 DIAGNOSIS — Z794 Long term (current) use of insulin: Secondary | ICD-10-CM | POA: Diagnosis not present

## 2024-11-17 DIAGNOSIS — E538 Deficiency of other specified B group vitamins: Secondary | ICD-10-CM | POA: Diagnosis not present

## 2025-03-30 ENCOUNTER — Ambulatory Visit: Admit: 2025-03-30 | Admitting: Gastroenterology

## 2025-03-30 SURGERY — COLONOSCOPY
Anesthesia: General
